# Patient Record
Sex: Female | Born: 1940 | Race: White | Hispanic: No | Marital: Married | Smoking: Never smoker
Health system: Southern US, Community
[De-identification: ages and names within clinical notes are randomized; demographics above are authoritative.]

## PROBLEM LIST (undated history)

## (undated) DIAGNOSIS — I10 Essential (primary) hypertension: Secondary | ICD-10-CM

## (undated) DIAGNOSIS — G5600 Carpal tunnel syndrome, unspecified upper limb: Secondary | ICD-10-CM

## (undated) DIAGNOSIS — N189 Chronic kidney disease, unspecified: Secondary | ICD-10-CM

## (undated) DIAGNOSIS — R519 Headache, unspecified: Secondary | ICD-10-CM

## (undated) DIAGNOSIS — E785 Hyperlipidemia, unspecified: Secondary | ICD-10-CM

## (undated) DIAGNOSIS — D369 Benign neoplasm, unspecified site: Secondary | ICD-10-CM

## (undated) DIAGNOSIS — R51 Headache: Secondary | ICD-10-CM

## (undated) DIAGNOSIS — N951 Menopausal and female climacteric states: Secondary | ICD-10-CM

## (undated) HISTORY — PX: COLONOSCOPY: SHX174

## (undated) HISTORY — PX: EYE SURGERY: SHX253

---

## 2005-03-28 ENCOUNTER — Ambulatory Visit: Payer: Self-pay | Admitting: Unknown Physician Specialty

## 2006-10-04 ENCOUNTER — Ambulatory Visit: Payer: Self-pay | Admitting: Internal Medicine

## 2007-12-16 IMAGING — US ABDOMEN ULTRASOUND
1 series · 17 of 25 positions shown · non-contrast
Comparison: none

REASON FOR EXAM: elevated bilirubin,   Research Study for [REDACTED]
COMMENTS:

[Series 1: abdomen ultrasound · 17 of 53 slices shown]
[im 1/53]
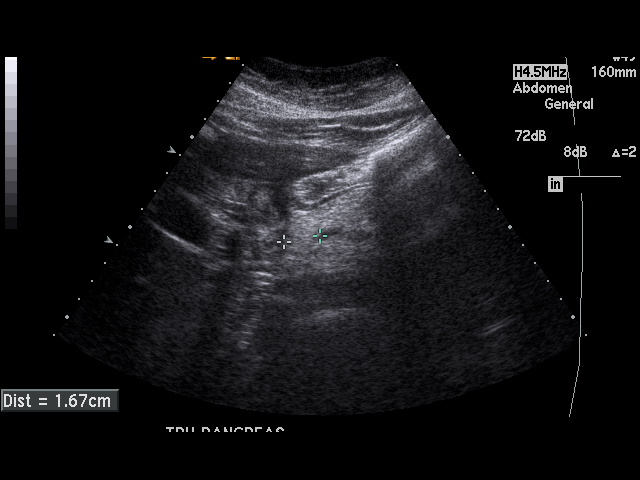
[im 5/53]
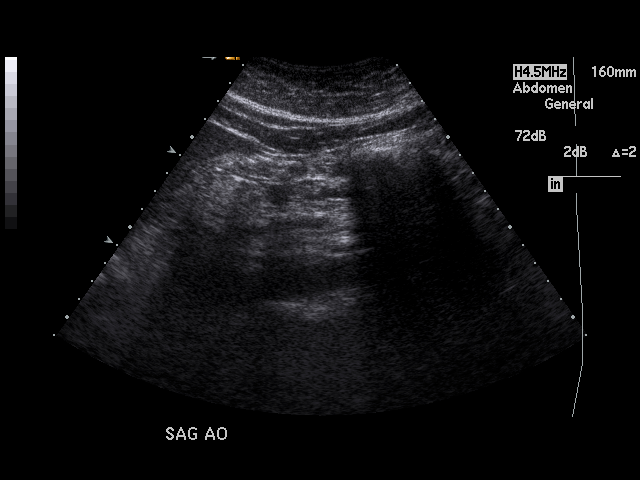
[im 7/53]
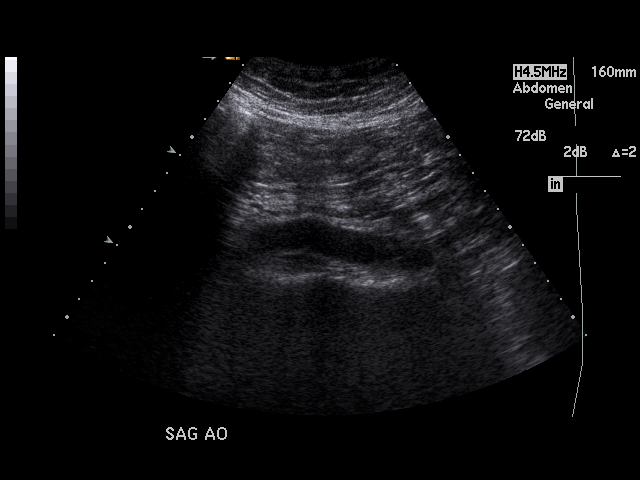
[im 11/53]
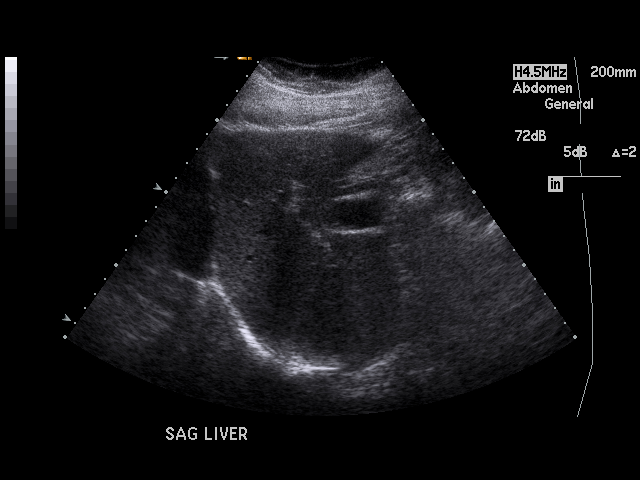
[im 14/53]
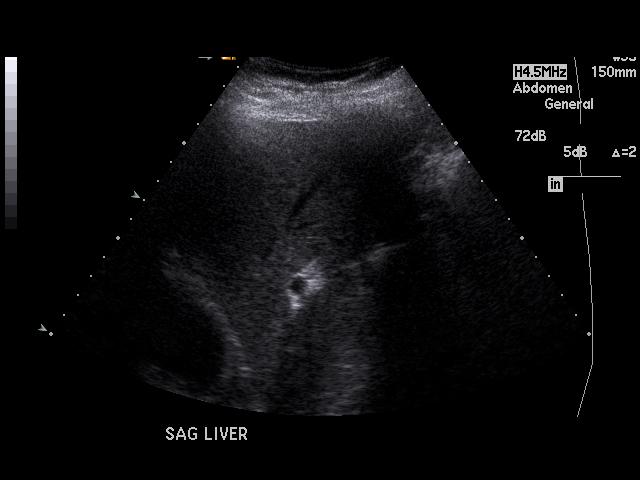
[im 18/53]
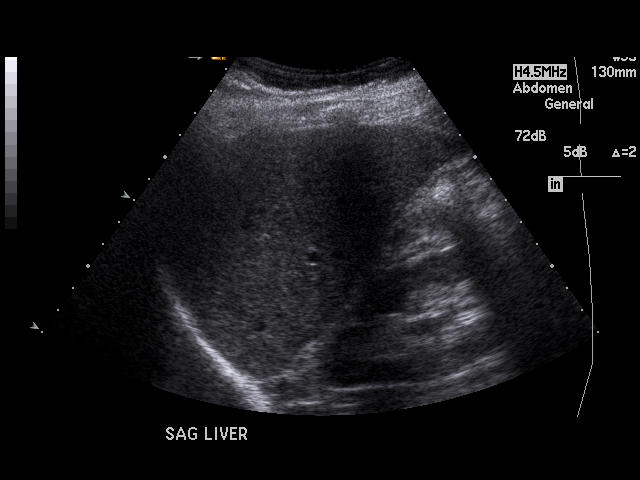
[im 20/53]
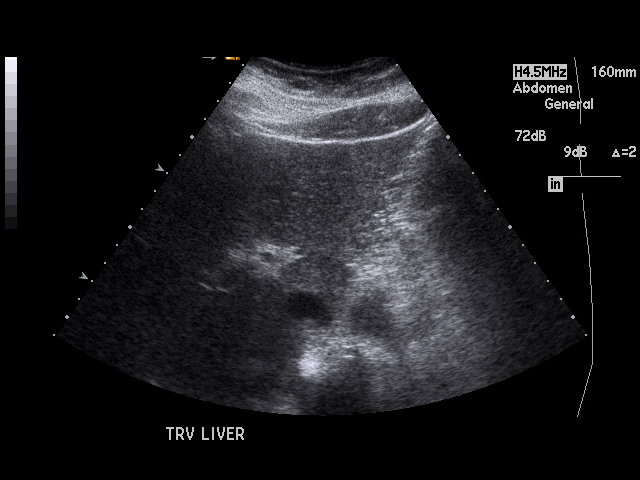
[im 24/53]
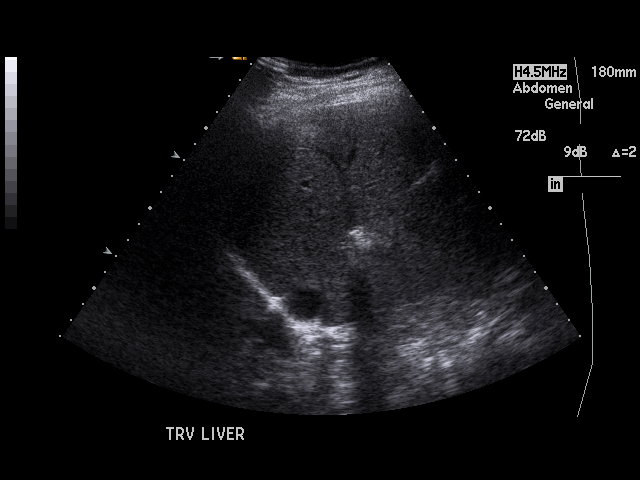
[im 27/53]
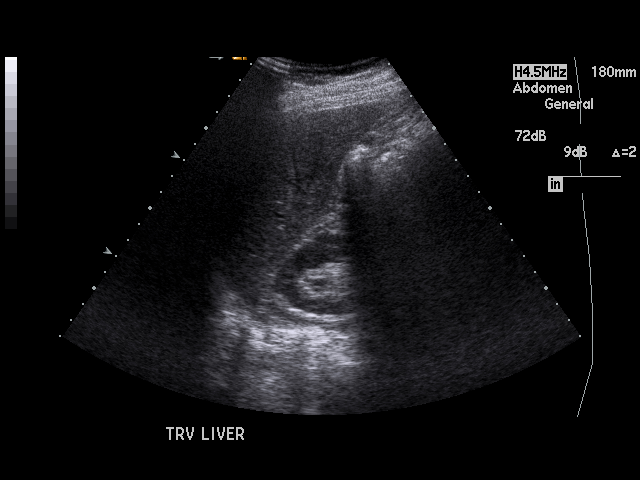
[im 29/53]
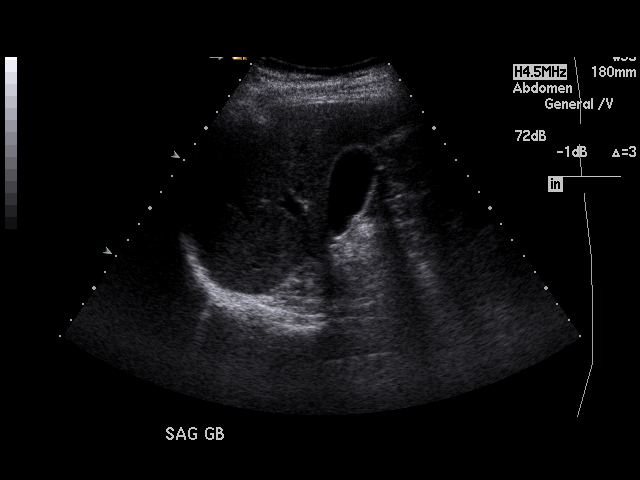
[im 33/53]
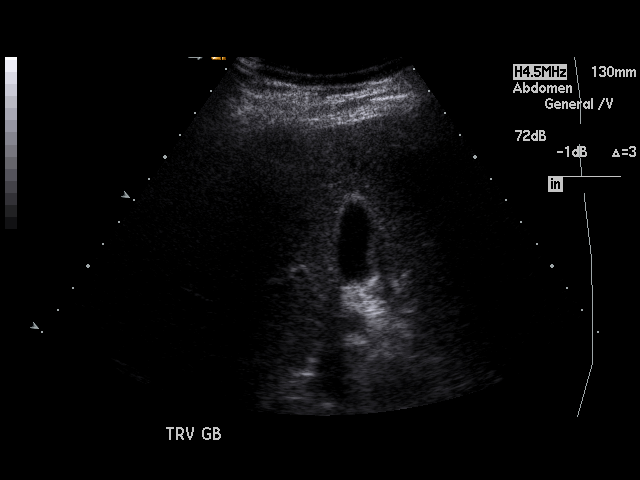
[im 35/53]
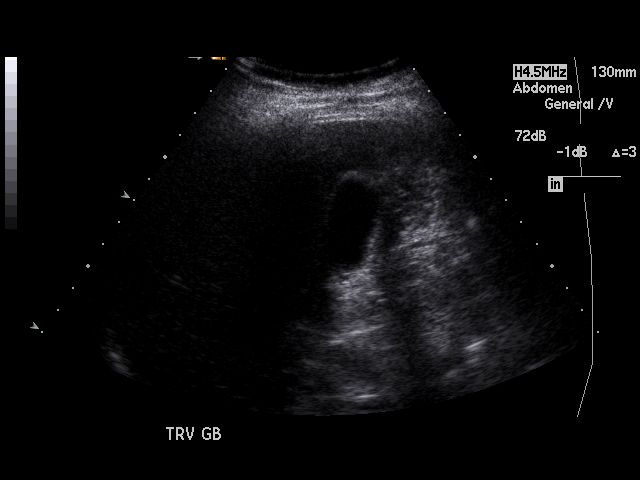
[im 40/53]
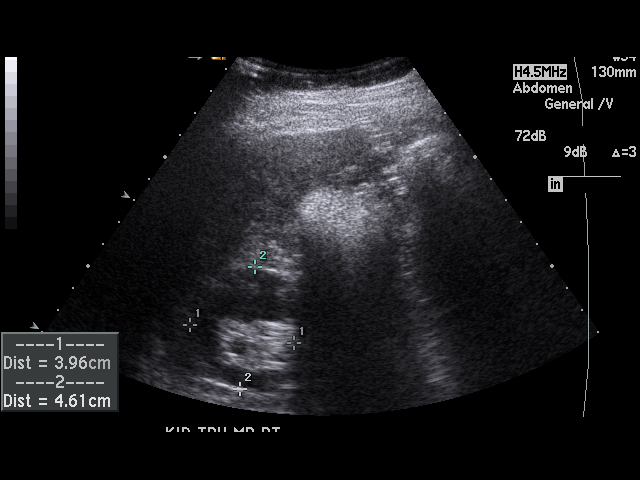
[im 42/53]
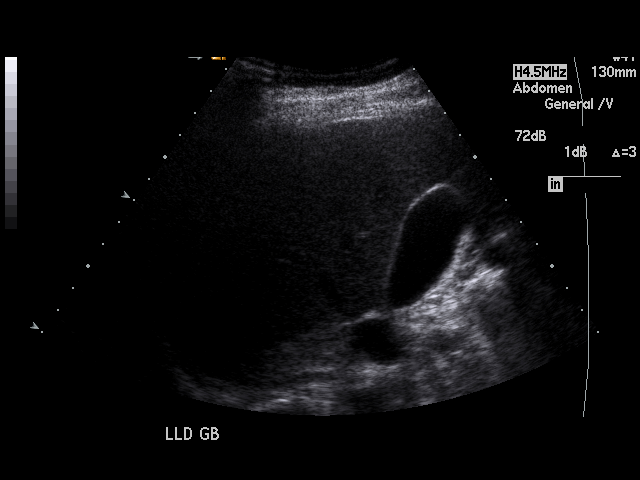
[im 46/53]
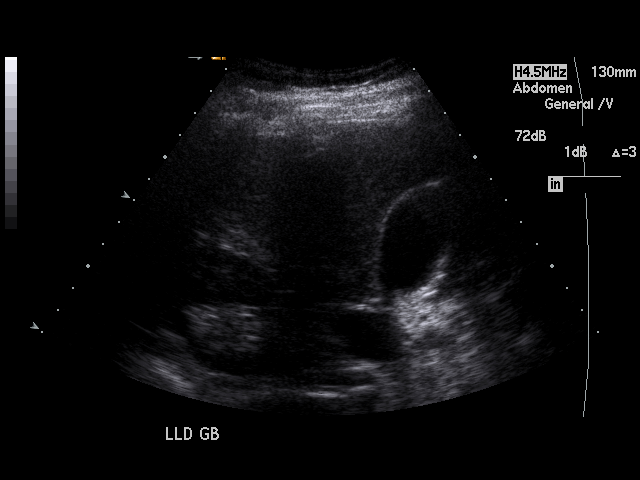
[im 48/53]
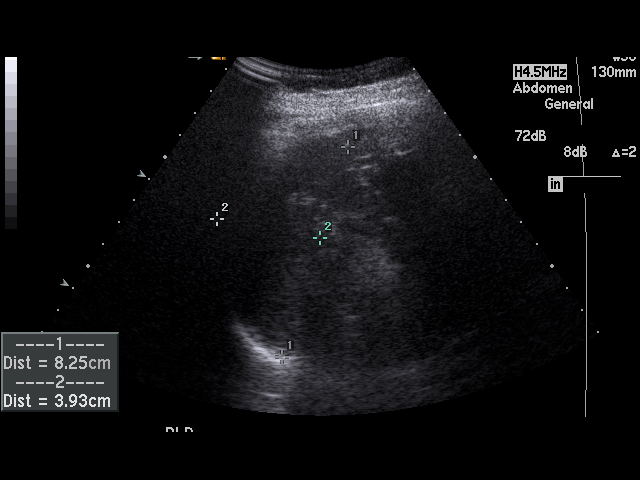
[im 53/53]
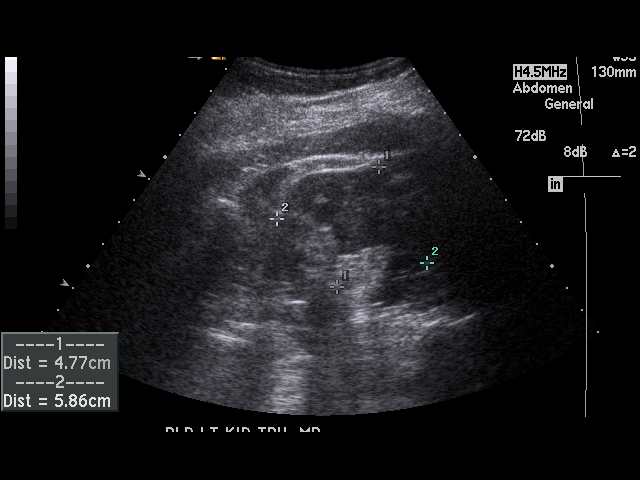

[17 of 25 positions shown; findings below may reference images not displayed]

PROCEDURE:     US  - US ABDOMEN GENERAL SURVEY  - October 04, 2006  [DATE]

RESULT:          The liver is normal in appearance sonographically. No
hepatic mass lesions are identified.  Spleen size is normal.  The pancreas
and abdominal aorta are visualized and show no significant abnormalities.
No gallstones are seen.  There is no thickening of the gallbladder wall.
The common bile duct measures 3.0 mm in diameter which is within normal
limits.  There is hepatopetal flow in the portal vein.  No ascites is seen.
The kidneys show no hydronephrosis.
IMPRESSION: No significant abnormalities are noted.

## 2010-06-20 ENCOUNTER — Ambulatory Visit: Payer: Self-pay | Admitting: Unknown Physician Specialty

## 2015-07-11 ENCOUNTER — Ambulatory Visit
Admission: RE | Admit: 2015-07-11 | Discharge: 2015-07-11 | Disposition: A | Discharge status: Home / Self Care | Payer: Medicare Other | Source: Ambulatory Visit | Attending: Unknown Physician Specialty | Admitting: Unknown Physician Specialty

## 2015-07-11 ENCOUNTER — Ambulatory Visit: Payer: Medicare Other | Admitting: Anesthesiology

## 2015-07-11 ENCOUNTER — Encounter: Payer: Self-pay | Admitting: *Deleted

## 2015-07-11 ENCOUNTER — Encounter
Admission: RE | Disposition: A | Discharge status: Home / Self Care | Payer: Self-pay | Source: Ambulatory Visit | Attending: Unknown Physician Specialty

## 2015-07-11 DIAGNOSIS — K640 First degree hemorrhoids: Secondary | ICD-10-CM | POA: Diagnosis not present

## 2015-07-11 DIAGNOSIS — K5730 Diverticulosis of large intestine without perforation or abscess without bleeding: Secondary | ICD-10-CM | POA: Insufficient documentation

## 2015-07-11 DIAGNOSIS — Z79899 Other long term (current) drug therapy: Secondary | ICD-10-CM | POA: Insufficient documentation

## 2015-07-11 DIAGNOSIS — Z09 Encounter for follow-up examination after completed treatment for conditions other than malignant neoplasm: Secondary | ICD-10-CM | POA: Diagnosis not present

## 2015-07-11 DIAGNOSIS — I10 Essential (primary) hypertension: Secondary | ICD-10-CM | POA: Diagnosis not present

## 2015-07-11 DIAGNOSIS — E785 Hyperlipidemia, unspecified: Secondary | ICD-10-CM | POA: Insufficient documentation

## 2015-07-11 DIAGNOSIS — Z7982 Long term (current) use of aspirin: Secondary | ICD-10-CM | POA: Insufficient documentation

## 2015-07-11 DIAGNOSIS — Z86010 Personal history of colonic polyps: Secondary | ICD-10-CM | POA: Diagnosis present

## 2015-07-11 DIAGNOSIS — G709 Myoneural disorder, unspecified: Secondary | ICD-10-CM | POA: Diagnosis not present

## 2015-07-11 HISTORY — DX: Carpal tunnel syndrome, unspecified upper limb: G56.00

## 2015-07-11 HISTORY — PX: COLONOSCOPY WITH PROPOFOL: SHX5780

## 2015-07-11 HISTORY — DX: Headache: R51

## 2015-07-11 HISTORY — DX: Essential (primary) hypertension: I10

## 2015-07-11 HISTORY — DX: Headache, unspecified: R51.9

## 2015-07-11 HISTORY — DX: Benign neoplasm, unspecified site: D36.9

## 2015-07-11 HISTORY — DX: Hyperlipidemia, unspecified: E78.5

## 2015-07-11 HISTORY — DX: Menopausal and female climacteric states: N95.1

## 2015-07-11 SURGERY — COLONOSCOPY WITH PROPOFOL
Anesthesia: General

## 2015-07-11 MED ORDER — FENTANYL CITRATE (PF) 100 MCG/2ML IJ SOLN
INTRAMUSCULAR | Status: DC | PRN
  Administered 2015-07-11: 50 ug via INTRAVENOUS

## 2015-07-11 MED ORDER — MIDAZOLAM HCL 2 MG/2ML IJ SOLN
INTRAMUSCULAR | Status: DC | PRN
  Administered 2015-07-11: 1 mg via INTRAVENOUS

## 2015-07-11 MED ORDER — LACTATED RINGERS IV SOLN
INTRAVENOUS | Status: DC
  Administered 2015-07-11: 14:00:00 via INTRAVENOUS

## 2015-07-11 MED ORDER — SODIUM CHLORIDE 0.9 % IV SOLN
INTRAVENOUS | Status: DC
  Administered 2015-07-11: 13:00:00 via INTRAVENOUS

## 2015-07-11 MED ORDER — PROPOFOL INFUSION 10 MG/ML OPTIME
INTRAVENOUS | Status: DC | PRN
  Administered 2015-07-11: 120 ug/kg/min via INTRAVENOUS

## 2015-07-11 NOTE — Transfer of Care (Signed)
Immediate Anesthesia Transfer of Care Note  Patient: Doris Andrews  Procedure(s) Performed: Procedure(s): COLONOSCOPY WITH PROPOFOL (N/A)  Patient Location: PACU  Anesthesia Type:General  Level of Consciousness: awake and sedated  Airway & Oxygen Therapy: Patient Spontanous Breathing and Patient connected to nasal cannula oxygen  Post-op Assessment: Report given to RN and Post -op Vital signs reviewed and stable  Post vital signs: Reviewed and stable  Last Vitals:  Filed Vitals:   07/11/15 1310  BP: 99/81  Pulse: 60  Temp: 36.6 C  Resp: 18    Complications: No apparent anesthesia complications

## 2015-07-11 NOTE — Anesthesia Procedure Notes (Signed)
Performed by: Vaughan Sine Pre-anesthesia Checklist: Patient identified, Emergency Drugs available, Suction available, Patient being monitored and Timeout performed Oxygen Delivery Method: Nasal cannula Preoxygenation: Pre-oxygenation with 100% oxygen Intubation Type: IV induction Placement Confirmation: positive ETCO2 and CO2 detector

## 2015-07-11 NOTE — Anesthesia Postprocedure Evaluation (Signed)
  Anesthesia Post-op Note  Patient: Doris Andrews  Procedure(s) Performed: Procedure(s): COLONOSCOPY WITH PROPOFOL (N/A)  Anesthesia type:General  Patient location: PACU  Post pain: Pain level controlled  Post assessment: Post-op Vital signs reviewed, Patient's Cardiovascular Status Stable, Respiratory Function Stable, Patent Airway and No signs of Nausea or vomiting  Post vital signs: Reviewed and stable  Last Vitals:  Filed Vitals:   07/11/15 1454  BP: 134/63  Pulse: 57  Temp:   Resp: 14    Level of consciousness: awake, alert  and patient cooperative  Complications: No apparent anesthesia complications

## 2015-07-11 NOTE — Anesthesia Preprocedure Evaluation (Signed)
Anesthesia Evaluation  Patient identified by MRN, date of birth, ID band Patient awake    Reviewed: Allergy & Precautions, H&P , NPO status , Patient's Chart, lab work & pertinent test results, reviewed documented beta blocker date and time   Airway Mallampati: III  TM Distance: >3 FB Neck ROM: limited    Dental no notable dental hx. (+) Teeth Intact   Pulmonary neg pulmonary ROS,  breath sounds clear to auscultation  Pulmonary exam normal       Cardiovascular Exercise Tolerance: Good hypertension, Normal cardiovascular examRhythm:regular Rate:Normal     Neuro/Psych  Headaches,  Neuromuscular disease negative neurological ROS  negative psych ROS   GI/Hepatic negative GI ROS, Neg liver ROS,   Endo/Other  negative endocrine ROS  Renal/GU negative Renal ROS  negative genitourinary   Musculoskeletal   Abdominal   Peds  Hematology negative hematology ROS (+)   Anesthesia Other Findings Past Medical History:   Hypertension                                                 Headache                                                     Hyperlipidemia                                               Adenomatous polyps                                           Carpal tunnel syndrome                                       Menopausal syndrome                                          Reproductive/Obstetrics negative OB ROS                             Anesthesia Physical Anesthesia Plan  ASA: III  Anesthesia Plan: General   Post-op Pain Management:    Induction:   Airway Management Planned:   Additional Equipment:   Intra-op Plan:   Post-operative Plan:   Informed Consent: I have reviewed the patients History and Physical, chart, labs and discussed the procedure including the risks, benefits and alternatives for the proposed anesthesia with the patient or authorized representative who has  indicated his/her understanding and acceptance.   Dental Advisory Given  Plan Discussed with: Anesthesiologist, CRNA and Surgeon  Anesthesia Plan Comments:         Anesthesia Quick Evaluation

## 2015-07-11 NOTE — H&P (Signed)
Primary Care Physician:  Kirk Ruths., MD Primary Gastroenterologist:  Dr. Vira Agar  Pre-Procedure History & Physical: HPI:  Doris Andrews is a 74 y.o. female is here for an colonoscopy.   Past Medical History  Diagnosis Date  . Hypertension   . Headache   . Hyperlipidemia   . Adenomatous polyps   . Carpal tunnel syndrome   . Menopausal syndrome     Past Surgical History  Procedure Laterality Date  . Colonoscopy      Prior to Admission medications   Medication Sig Start Date End Date Taking? Authorizing Provider  ascorbic acid (VITAMIN C) 500 MG tablet Take 500 mg by mouth daily.   Yes Historical Provider, MD  aspirin EC 81 MG tablet Take 81 mg by mouth daily.   Yes Historical Provider, MD  Cholecalciferol 1000 UNITS TBDP Take 1 tablet by mouth daily.   Yes Historical Provider, MD  losartan (COZAAR) 100 MG tablet Take 100 mg by mouth daily.   Yes Historical Provider, MD  metoprolol succinate (TOPROL-XL) 100 MG 24 hr tablet Take 100 mg by mouth daily. Take with or immediately following a meal.   Yes Historical Provider, MD  Multiple Vitamin (MULTIVITAMIN) tablet Take 1 tablet by mouth daily.   Yes Historical Provider, MD  simvastatin (ZOCOR) 40 MG tablet Take 40 mg by mouth daily.   Yes Historical Provider, MD    Allergies as of 05/31/2015  . (Not on File)    History reviewed. No pertinent family history.  History   Social History  . Marital Status: Married    Spouse Name: N/A  . Number of Children: N/A  . Years of Education: N/A   Occupational History  . Not on file.   Social History Main Topics  . Smoking status: Never Smoker   . Smokeless tobacco: Never Used  . Alcohol Use: 0.6 oz/week    1 Shots of liquor per week  . Drug Use: No  . Sexual Activity: Not on file   Other Topics Concern  . Not on file   Social History Narrative    Review of Systems: See HPI, otherwise negative ROS  Physical Exam: BP 99/81 mmHg  Pulse 60  Temp(Src)  97.9 F (36.6 C) (Tympanic)  Resp 18  Ht 5' (1.524 m)  Wt 74.844 kg (165 lb)  BMI 32.22 kg/m2  SpO2 99% General:   Alert,  pleasant and cooperative in NAD Head:  Normocephalic and atraumatic. Neck:  Supple; no masses or thyromegaly. Lungs:  Clear throughout to auscultation.    Heart:  Regular rate and rhythm. Abdomen:  Soft, nontender and nondistended. Normal bowel sounds, without guarding, and without rebound.   Neurologic:  Alert and  oriented x4;  grossly normal neurologically.  Impression/Plan: Doris Andrews is here for an colonoscopy to be performed for colonoscopy  Risks, benefits, limitations, and alternatives regarding  colonoscopy have been reviewed with the patient.  Questions have been answered.  All parties agreeable.   Gaylyn Cheers, MD  07/11/2015, 1:49 PM   Primary Care Physician:  Kirk Ruths., MD Primary Gastroenterologist:  Dr. Vira Agar  Pre-Procedure History & Physical: HPI:  Doris Andrews is a 74 y.o. female is here for an colonoscopy.   Past Medical History  Diagnosis Date  . Hypertension   . Headache   . Hyperlipidemia   . Adenomatous polyps   . Carpal tunnel syndrome   . Menopausal syndrome     Past Surgical History  Procedure Laterality Date  .  Colonoscopy      Prior to Admission medications   Medication Sig Start Date End Date Taking? Authorizing Provider  ascorbic acid (VITAMIN C) 500 MG tablet Take 500 mg by mouth daily.   Yes Historical Provider, MD  aspirin EC 81 MG tablet Take 81 mg by mouth daily.   Yes Historical Provider, MD  Cholecalciferol 1000 UNITS TBDP Take 1 tablet by mouth daily.   Yes Historical Provider, MD  losartan (COZAAR) 100 MG tablet Take 100 mg by mouth daily.   Yes Historical Provider, MD  metoprolol succinate (TOPROL-XL) 100 MG 24 hr tablet Take 100 mg by mouth daily. Take with or immediately following a meal.   Yes Historical Provider, MD  Multiple Vitamin (MULTIVITAMIN) tablet Take 1 tablet by  mouth daily.   Yes Historical Provider, MD  simvastatin (ZOCOR) 40 MG tablet Take 40 mg by mouth daily.   Yes Historical Provider, MD    Allergies as of 05/31/2015  . (Not on File)    History reviewed. No pertinent family history.  History   Social History  . Marital Status: Married    Spouse Name: N/A  . Number of Children: N/A  . Years of Education: N/A   Occupational History  . Not on file.   Social History Main Topics  . Smoking status: Never Smoker   . Smokeless tobacco: Never Used  . Alcohol Use: 0.6 oz/week    1 Shots of liquor per week  . Drug Use: No  . Sexual Activity: Not on file   Other Topics Concern  . Not on file   Social History Narrative    Review of Systems: See HPI, otherwise negative ROS  Physical Exam: BP 99/81 mmHg  Pulse 60  Temp(Src) 97.9 F (36.6 C) (Tympanic)  Resp 18  Ht 5' (1.524 m)  Wt 74.844 kg (165 lb)  BMI 32.22 kg/m2  SpO2 99% General:   Alert,  pleasant and cooperative in NAD Head:  Normocephalic and atraumatic. Neck:  Supple; no masses or thyromegaly. Lungs:  Clear throughout to auscultation.    Heart:  Regular rate and rhythm. Abdomen:  Soft, nontender and nondistended. Normal bowel sounds, without guarding, and without rebound.   Neurologic:  Alert and  oriented x4;  grossly normal neurologically.  Impression/Plan: Doris Andrews is here for an colonoscopy to be performed for personal hx of colon polyps  Risks, benefits, limitations, and alternatives regarding  colonoscopy have been reviewed with the patient.  Questions have been answered.  All parties agreeable.   Gaylyn Cheers, MD  07/11/2015, 1:49 PM

## 2015-07-11 NOTE — Op Note (Signed)
[facility] Gastroenterology Patient Name: Doris Andrews Procedure Date: 07/11/2015 1:45 PM MRN: [id number] Account #: [id number] Date of Birth: 05/09/1941 Admit Type: Outpatient Age: 75 Room: [facility] ENDO ROOM 1 Gender: Female Note Status: Finalized Procedure:         Colonoscopy Indications:       High risk colon cancer surveillance: Personal history of                     colonic polyps Providers:         Manya Silvas, MD Referring MD:      Ocie Cornfield. Ouida Sills, MD (Referring MD) Medicines:         Propofol per Anesthesia Complications:     No immediate complications. Procedure:         Pre-Anesthesia Assessment:                    - After reviewing the risks and benefits, the patient was                     deemed in satisfactory condition to undergo the procedure.                    After obtaining informed consent, the colonoscope was                     passed under direct vision. Throughout the procedure, the                     patient's blood pressure, pulse, and oxygen saturations                     were monitored continuously. The Colonoscope was                     introduced through the anus and advanced to the the cecum,                     identified by appendiceal orifice and ileocecal valve. The                     colonoscopy was somewhat difficult due to significant                     looping. Successful completion of the procedure was aided                     by applying abdominal pressure. The patient tolerated the                     procedure well. The quality of the bowel preparation was                     excellent. Findings:      A few small-mouthed diverticula were found in the sigmoid colon.      Internal hemorrhoids were found during endoscopy. The hemorrhoids were       small and Grade I (internal hemorrhoids that do not prolapse).      The exam was otherwise without abnormality. Impression:        - Diverticulosis in the  sigmoid colon.                    - Internal hemorrhoids.                    -  The examination was otherwise normal.                    - No specimens collected. Recommendation:    - Repeat colonoscopy in 5 years for surveillance. Manya Silvas, MD 07/11/2015 2:21:14 PM This report has been signed electronically. Number of Addenda: 0 Note Initiated On: 07/11/2015 1:45 PM Scope Withdrawal Time: 0 hours 6 minutes 52 seconds  Total Procedure Duration: 0 hours 23 minutes 56 seconds       [facility]

## 2015-07-14 ENCOUNTER — Encounter: Payer: Self-pay | Admitting: Unknown Physician Specialty

## 2020-01-01 ENCOUNTER — Ambulatory Visit: Payer: Medicare Other | Attending: Internal Medicine

## 2020-01-01 DIAGNOSIS — Z23 Encounter for immunization: Secondary | ICD-10-CM | POA: Insufficient documentation

## 2020-01-01 NOTE — Progress Notes (Signed)
   Covid-19 Vaccination Clinic  Name:  JOSLYN COLCORD    MRN: [id number] DOB: 1941/03/28  01/01/2020  Ms. Pow was observed post Covid-19 immunization for 15 minutes without incidence. She was provided with Vaccine Information Sheet and instruction to access the V-Safe system.   Ms. Patch was instructed to call 911 with any severe reactions post vaccine: Marland Kitchen Difficulty breathing  . Swelling of your face and throat  . A fast heartbeat  . A bad rash all over your body  . Dizziness and weakness    Immunizations Administered    Name Date Dose VIS Date Route   Pfizer COVID-19 Vaccine 01/01/2020 11:18 AM 0.3 mL 11/20/2019 Intramuscular   Manufacturer: [employer], [employer]   Lot: [id number]   [place]: [id number]

## 2020-01-22 ENCOUNTER — Ambulatory Visit: Payer: Medicare Other | Attending: Internal Medicine

## 2020-01-22 DIAGNOSIS — Z23 Encounter for immunization: Secondary | ICD-10-CM | POA: Insufficient documentation

## 2020-01-22 NOTE — Progress Notes (Signed)
   Covid-19 Vaccination Clinic  Name:  Doris Andrews    MRN: [id number] DOB: 05/02/1941  01/22/2020  [name] was observed post Covid-19 immunization for 15 minutes without incidence. She was provided with Vaccine Information Sheet and instruction to access the V-Safe system.   [name] was instructed to call 911 with any severe reactions post vaccine: Marland Kitchen Difficulty breathing  . Swelling of your face and throat  . A fast heartbeat  . A bad rash all over your body  . Dizziness and weakness    Immunizations Administered    Name Date Dose VIS Date Route   Pfizer COVID-19 Vaccine 01/22/2020 11:29 AM 0.3 mL 11/20/2019 Intramuscular   Manufacturer: [place]   Lot: [id number]   [place]: [id number]

## 2021-08-04 ENCOUNTER — Encounter: Payer: Self-pay | Admitting: *Deleted

## 2021-08-07 ENCOUNTER — Ambulatory Visit
Admission: RE | Admit: 2021-08-07 | Discharge: 2021-08-07 | Disposition: A | Discharge status: Home / Self Care | Payer: Medicare Other | Attending: Gastroenterology | Admitting: Gastroenterology

## 2021-08-07 ENCOUNTER — Ambulatory Visit: Payer: Medicare Other | Admitting: Certified Registered Nurse Anesthetist

## 2021-08-07 ENCOUNTER — Encounter
Admission: RE | Disposition: A | Discharge status: Home / Self Care | Payer: Self-pay | Source: Home / Self Care | Attending: Gastroenterology

## 2021-08-07 DIAGNOSIS — Z1211 Encounter for screening for malignant neoplasm of colon: Secondary | ICD-10-CM | POA: Diagnosis not present

## 2021-08-07 DIAGNOSIS — K640 First degree hemorrhoids: Secondary | ICD-10-CM | POA: Diagnosis not present

## 2021-08-07 DIAGNOSIS — I129 Hypertensive chronic kidney disease with stage 1 through stage 4 chronic kidney disease, or unspecified chronic kidney disease: Secondary | ICD-10-CM | POA: Diagnosis not present

## 2021-08-07 DIAGNOSIS — N189 Chronic kidney disease, unspecified: Secondary | ICD-10-CM | POA: Diagnosis not present

## 2021-08-07 DIAGNOSIS — K5730 Diverticulosis of large intestine without perforation or abscess without bleeding: Secondary | ICD-10-CM | POA: Diagnosis not present

## 2021-08-07 DIAGNOSIS — Z86010 Personal history of colonic polyps: Secondary | ICD-10-CM | POA: Insufficient documentation

## 2021-08-07 HISTORY — DX: Chronic kidney disease, unspecified: N18.9

## 2021-08-07 HISTORY — PX: COLONOSCOPY WITH PROPOFOL: SHX5780

## 2021-08-07 SURGERY — COLONOSCOPY WITH PROPOFOL
Anesthesia: General

## 2021-08-07 MED ORDER — SODIUM CHLORIDE 0.9 % IV SOLN
INTRAVENOUS | Status: DC
  Administered 2021-08-07: 1000 mL via INTRAVENOUS

## 2021-08-07 MED ORDER — PROPOFOL 500 MG/50ML IV EMUL
INTRAVENOUS | Status: DC | PRN
  Administered 2021-08-07: 160 ug/kg/min via INTRAVENOUS

## 2021-08-07 MED ORDER — PROPOFOL 10 MG/ML IV BOLUS
INTRAVENOUS | Status: DC | PRN
  Administered 2021-08-07: 20 mg via INTRAVENOUS
  Administered 2021-08-07: 40 mg via INTRAVENOUS

## 2021-08-07 NOTE — Op Note (Signed)
[facility] Gastroenterology Patient Name: Doris Andrews Procedure Date: 08/07/2021 10:14 AM MRN: [id number] Account #: [id number] Date of Birth: 1941-07-12 Admit Type: Outpatient Age: 80 Room: [facility] ENDO ROOM 3 Gender: Female Note Status: Finalized Procedure:             Colonoscopy Indications:           Surveillance: Personal history of adenomatous polyps                         on last colonoscopy > 5 years ago Providers:             Andrey Farmer MD, MD Medicines:             Monitored Anesthesia Care Complications:         No immediate complications. Procedure:             Pre-Anesthesia Assessment:                        - Prior to the procedure, a History and Physical was                         performed, and patient medications and allergies were                         reviewed. The patient is competent. The risks and                         benefits of the procedure and the sedation options and                         risks were discussed with the patient. All questions                         were answered and informed consent was obtained.                         Patient identification and proposed procedure were                         verified by the physician, the nurse, the anesthetist                         and the technician in the endoscopy suite. Mental                         Status Examination: alert and oriented. Airway                         Examination: normal oropharyngeal airway and neck                         mobility. Respiratory Examination: clear to                         auscultation. CV Examination: normal. Prophylactic                         Antibiotics: The patient does not require prophylactic  antibiotics. Prior Anticoagulants: The patient has                         taken no previous anticoagulant or antiplatelet                         agents. ASA Grade Assessment: II - A patient with mild                          systemic disease. After reviewing the risks and                         benefits, the patient was deemed in satisfactory                         condition to undergo the procedure. The anesthesia                         plan was to use monitored anesthesia care (MAC).                         Immediately prior to administration of medications,                         the patient was re-assessed for adequacy to receive                         sedatives. The heart rate, respiratory rate, oxygen                         saturations, blood pressure, adequacy of pulmonary                         ventilation, and response to care were monitored                         throughout the procedure. The physical status of the                         patient was re-assessed after the procedure.                        After obtaining informed consent, the colonoscope was                         passed under direct vision. Throughout the procedure,                         the patient's blood pressure, pulse, and oxygen                         saturations were monitored continuously. The                         Colonoscope was introduced through the anus and                         advanced to the the cecum, identified by appendiceal  orifice and ileocecal valve. The colonoscopy was                         performed without difficulty. The patient tolerated                         the procedure well. The quality of the bowel                         preparation was excellent. Findings:      The perianal and digital rectal examinations were normal.      A few small-mouthed diverticula were found in the sigmoid colon.      Internal hemorrhoids were found during retroflexion. The hemorrhoids       were Grade I (internal hemorrhoids that do not prolapse).      The exam was otherwise without abnormality on direct and retroflexion       views. Impression:            -  Diverticulosis in the sigmoid colon.                        - Internal hemorrhoids.                        - The examination was otherwise normal on direct and                         retroflexion views.                        - No specimens collected. Recommendation:        - Discharge patient to home.                        - Resume previous diet.                        - Continue present medications.                        - Repeat colonoscopy is not recommended due to current                         age (26 years or older) for surveillance.                        - Return to referring physician as previously                         scheduled. Procedure Code(s):     --- Professional ---                        H2197, Colorectal cancer screening; colonoscopy on                         individual at high risk Diagnosis Code(s):     --- Professional ---                        Z86.010, Personal history of colonic polyps  K64.0, First degree hemorrhoids                        K57.30, Diverticulosis of large intestine without                         perforation or abscess without bleeding CPT copyright 2019 American Medical Association. All rights reserved. The codes documented in this report are preliminary and upon coder review may  be revised to meet current compliance requirements. Andrey Farmer MD, MD 08/07/2021 10:44:47 AM Number of Addenda: 0 Note Initiated On: 08/07/2021 10:14 AM Scope Withdrawal Time: 0 hours 7 minutes 52 seconds  Total Procedure Duration: 0 hours 14 minutes 12 seconds  Estimated Blood Loss:  Estimated blood loss: none.      [facility]

## 2021-08-07 NOTE — Anesthesia Preprocedure Evaluation (Signed)
Anesthesia Evaluation  Patient identified by MRN, date of birth, ID band Patient awake    Reviewed: Allergy & Precautions, H&P , NPO status , Patient's Chart, lab work & pertinent test results, reviewed documented beta blocker date and time   Airway Mallampati: II   Neck ROM: full    Dental  (+) Poor Dentition   Pulmonary neg pulmonary ROS,    Pulmonary exam normal        Cardiovascular Exercise Tolerance: Poor hypertension, On Medications negative cardio ROS Normal cardiovascular exam Rhythm:regular Rate:Normal     Neuro/Psych  Headaches,  Neuromuscular disease negative psych ROS   GI/Hepatic negative GI ROS, Neg liver ROS,   Endo/Other  negative endocrine ROS  Renal/GU Renal disease  negative genitourinary   Musculoskeletal   Abdominal   Peds  Hematology negative hematology ROS (+)   Anesthesia Other Findings Past Medical History: No date: Adenomatous polyps No date: Carpal tunnel syndrome No date: Chronic kidney disease No date: Headache No date: Hyperlipidemia No date: Hypertension No date: Menopausal syndrome Past Surgical History: No date: COLONOSCOPY 07/11/2015: COLONOSCOPY WITH PROPOFOL; N/A     Comment:  Procedure: COLONOSCOPY WITH PROPOFOL;  Surgeon: Manya Silvas, MD;  Location: [facility] ENDOSCOPY;  Service:               Endoscopy;  Laterality: N/A; No date: EYE SURGERY BMI    Body Mass Index: 27.40 kg/m     Reproductive/Obstetrics negative OB ROS                             Anesthesia Physical Anesthesia Plan  ASA: 3  Anesthesia Plan: General   Post-op Pain Management:    Induction:   PONV Risk Score and Plan:   Airway Management Planned:   Additional Equipment:   Intra-op Plan:   Post-operative Plan:   Informed Consent: I have reviewed the patients History and Physical, chart, labs and discussed the procedure including the risks,  benefits and alternatives for the proposed anesthesia with the patient or authorized representative who has indicated his/her understanding and acceptance.     Dental Advisory Given  Plan Discussed with: CRNA  Anesthesia Plan Comments:         Anesthesia Quick Evaluation

## 2021-08-07 NOTE — Transfer of Care (Signed)
Immediate Anesthesia Transfer of Care Note  Patient: Doris Andrews  Procedure(s) Performed: COLONOSCOPY WITH PROPOFOL  Patient Location: PACU  Anesthesia Type:General  Level of Consciousness: drowsy  Airway & Oxygen Therapy: Patient Spontanous Breathing and Patient connected to nasal cannula oxygen  Post-op Assessment: Report given to RN and Post -op Vital signs reviewed and stable  Post vital signs: Reviewed and stable  Last Vitals:  Vitals Value Taken Time  BP    Temp    Pulse    Resp    SpO2      Last Pain:  Vitals:   08/07/21 0952  PainSc: 0-No pain         Complications: No notable events documented.

## 2021-08-07 NOTE — H&P (Signed)
Outpatient short stay form Pre-procedure 08/07/2021  Lesly Rubenstein, MD  Primary Physician: Kirk Ruths, MD  Reason for visit:  Surveillance colonoscopy  History of present illness:   79 y/o lady with history of hypertension here for surveillance colonoscopy. Last colonoscopy was 5 years ago with no polyps but had adenomatous polyps on previous colonoscopies. No abdominal surgeries. No blood thinners. No family history of GI malignancies.    Current Facility-Administered Medications:    0.9 %  sodium chloride infusion, , Intravenous, Continuous, Kristan Votta, Hilton Cork, MD, Last Rate: 20 mL/hr at 08/07/21 1015, Continued from Pre-op at 08/07/21 1015  Medications Prior to Admission  Medication Sig Dispense Refill Last Dose   ascorbic acid (VITAMIN C) 500 MG tablet Take 500 mg by mouth daily.   08/06/2021   aspirin EC 81 MG tablet Take 81 mg by mouth daily.   08/06/2021   Cholecalciferol 1000 UNITS TBDP Take 1 tablet by mouth daily.   08/06/2021   losartan (COZAAR) 100 MG tablet Take 100 mg by mouth daily.   08/07/2021 at 0530   metoprolol succinate (TOPROL-XL) 100 MG 24 hr tablet Take 100 mg by mouth daily. Take with or immediately following a meal.   08/07/2021 at 0530   Multiple Vitamin (MULTIVITAMIN) tablet Take 1 tablet by mouth daily.   08/06/2021   simvastatin (ZOCOR) 40 MG tablet Take 40 mg by mouth daily.   08/07/2021 at 0530     Allergies  Allergen Reactions   Ace Inhibitors    Amoxicillin      Past Medical History:  Diagnosis Date   Adenomatous polyps    Carpal tunnel syndrome    Chronic kidney disease    Headache    Hyperlipidemia    Hypertension    Menopausal syndrome     Review of systems:  Otherwise negative.    Physical Exam  Gen: Alert, oriented. Appears stated age.  HEENT: PERRLA. Lungs: No respiratory distress CV: RRR Abd: soft, benign, no masses Ext: No edema    Planned procedures: Proceed with colonoscopy. The patient understands the  nature of the planned procedure, indications, risks, alternatives and potential complications including but not limited to bleeding, infection, perforation, damage to internal organs and possible oversedation/side effects from anesthesia. The patient agrees and gives consent to proceed.  Please refer to procedure notes for findings, recommendations and patient disposition/instructions.     Lesly Rubenstein, MD [facility]

## 2021-08-07 NOTE — Interval H&P Note (Signed)
History and Physical Interval Note:  08/07/2021 10:22 AM  Doris Andrews  has presented today for surgery, with the diagnosis of PERSONAL HX.OF COLON POLYPS.  The various methods of treatment have been discussed with the patient and family. After consideration of risks, benefits and other options for treatment, the patient has consented to  Procedure(s): COLONOSCOPY WITH PROPOFOL (N/A) as a surgical intervention.  The patient's history has been reviewed, patient examined, no change in status, stable for surgery.  I have reviewed the patient's chart and labs.  Questions were answered to the patient's satisfaction.     Lesly Rubenstein  Ok to proceed with colonoscopy

## 2021-08-08 ENCOUNTER — Encounter: Payer: Self-pay | Admitting: Gastroenterology

## 2021-08-08 NOTE — Anesthesia Postprocedure Evaluation (Signed)
Anesthesia Post Note  Patient: Doris Andrews  Procedure(s) Performed: COLONOSCOPY WITH PROPOFOL  Patient location during evaluation: PACU Anesthesia Type: General Level of consciousness: awake and alert Pain management: pain level controlled Vital Signs Assessment: post-procedure vital signs reviewed and stable Respiratory status: spontaneous breathing, nonlabored ventilation, respiratory function stable and patient connected to nasal cannula oxygen Cardiovascular status: blood pressure returned to baseline and stable Postop Assessment: no apparent nausea or vomiting Anesthetic complications: no   No notable events documented.   Last Vitals:  Vitals:   08/07/21 1049 08/07/21 1055  BP: 122/68 130/66  Pulse: 63 60  Resp:  14  Temp: (!) 36.1 C   SpO2: 97% 100%    Last Pain:  Vitals:   08/08/21 0732  TempSrc:   PainSc: 0-No pain                 Molli Barrows

## 2023-07-23 ENCOUNTER — Other Ambulatory Visit: Payer: Self-pay | Admitting: Physician Assistant

## 2023-07-23 ENCOUNTER — Ambulatory Visit
Admission: RE | Admit: 2023-07-23 | Discharge: 2023-07-23 | Disposition: A | Discharge status: Home / Self Care | Payer: Medicare Other | Source: Ambulatory Visit | Attending: Physician Assistant | Admitting: Physician Assistant

## 2023-07-23 DIAGNOSIS — M79605 Pain in left leg: Secondary | ICD-10-CM | POA: Insufficient documentation

## 2023-07-23 DIAGNOSIS — M7989 Other specified soft tissue disorders: Secondary | ICD-10-CM | POA: Diagnosis present
